# Patient Record
Sex: Male | Born: 1980 | Race: Black or African American | Hispanic: No | Marital: Single | Smoking: Never smoker
Health system: Southern US, Community
[De-identification: ages and names within clinical notes are randomized; demographics above are authoritative.]

## PROBLEM LIST (undated history)

## (undated) HISTORY — PX: EYE SURGERY: SHX253

---

## 2010-07-19 ENCOUNTER — Inpatient Hospital Stay (HOSPITAL_COMMUNITY)
Admission: EM | Admit: 2010-07-19 | Discharge: 2010-07-24 | Payer: Self-pay | Source: Home / Self Care | Attending: Internal Medicine | Admitting: Internal Medicine

## 2010-07-21 LAB — DIFFERENTIAL
Basophils Absolute: 0 10*3/uL (ref 0.0–0.1)
Basophils Relative: 0 % (ref 0–1)
Eosinophils Absolute: 0 10*3/uL (ref 0.0–0.7)
Eosinophils Relative: 0 % (ref 0–5)
Lymphocytes Relative: 7 % — ABNORMAL LOW (ref 12–46)
Lymphs Abs: 1.4 10*3/uL (ref 0.7–4.0)
Monocytes Absolute: 1.2 10*3/uL — ABNORMAL HIGH (ref 0.1–1.0)
Monocytes Relative: 6 % (ref 3–12)
Neutro Abs: 17.7 10*3/uL — ABNORMAL HIGH (ref 1.7–7.7)
Neutrophils Relative %: 87 % — ABNORMAL HIGH (ref 43–77)

## 2010-07-21 LAB — BASIC METABOLIC PANEL
BUN: 37 mg/dL — ABNORMAL HIGH (ref 6–23)
BUN: 27 mg/dL — ABNORMAL HIGH (ref 6–23)
BUN: 25 mg/dL — ABNORMAL HIGH (ref 6–23)
BUN: 21 mg/dL (ref 6–23)
BUN: 20 mg/dL (ref 6–23)
CO2: 17 mEq/L — ABNORMAL LOW (ref 19–32)
CO2: 22 mEq/L (ref 19–32)
CO2: 23 mEq/L (ref 19–32)
CO2: 24 mEq/L (ref 19–32)
CO2: 22 mEq/L (ref 19–32)
Calcium: 9.3 mg/dL (ref 8.4–10.5)
Calcium: 9 mg/dL (ref 8.4–10.5)
Calcium: 8.8 mg/dL (ref 8.4–10.5)
Calcium: 8.8 mg/dL (ref 8.4–10.5)
Calcium: 8.3 mg/dL — ABNORMAL LOW (ref 8.4–10.5)
Chloride: 106 mEq/L (ref 96–112)
Chloride: 116 mEq/L — ABNORMAL HIGH (ref 96–112)
Chloride: 117 mEq/L — ABNORMAL HIGH (ref 96–112)
Chloride: 113 mEq/L — ABNORMAL HIGH (ref 96–112)
Chloride: 109 mEq/L (ref 96–112)
Creatinine, Ser: 2.54 mg/dL — ABNORMAL HIGH (ref 0.4–1.5)
Creatinine, Ser: 1.87 mg/dL — ABNORMAL HIGH (ref 0.4–1.5)
Creatinine, Ser: 1.66 mg/dL — ABNORMAL HIGH (ref 0.4–1.5)
Creatinine, Ser: 1.63 mg/dL — ABNORMAL HIGH (ref 0.4–1.5)
Creatinine, Ser: 1.6 mg/dL — ABNORMAL HIGH (ref 0.4–1.5)
GFR calc Af Amer: 36 mL/min — ABNORMAL LOW (ref >60)
GFR calc Af Amer: 52 mL/min — ABNORMAL LOW (ref >60)
GFR calc Af Amer: 60 mL/min — ABNORMAL LOW (ref >60)
GFR calc Af Amer: >60 mL/min (ref >60)
GFR calc Af Amer: >60 mL/min (ref >60)
GFR calc non Af Amer: 30 mL/min — ABNORMAL LOW (ref >60)
GFR calc non Af Amer: 43 mL/min — ABNORMAL LOW (ref >60)
GFR calc non Af Amer: 49 mL/min — ABNORMAL LOW (ref >60)
GFR calc non Af Amer: 50 mL/min — ABNORMAL LOW (ref >60)
GFR calc non Af Amer: 51 mL/min — ABNORMAL LOW (ref >60)
Glucose, Bld: 760 mg/dL (ref 70–99)
Glucose, Bld: 347 mg/dL — ABNORMAL HIGH (ref 70–99)
Glucose, Bld: 223 mg/dL — ABNORMAL HIGH (ref 70–99)
Glucose, Bld: 158 mg/dL — ABNORMAL HIGH (ref 70–99)
Glucose, Bld: 240 mg/dL — ABNORMAL HIGH (ref 70–99)
Potassium: 5.5 mEq/L — ABNORMAL HIGH (ref 3.5–5.1)
Potassium: 4.5 mEq/L (ref 3.5–5.1)
Potassium: 4.1 mEq/L (ref 3.5–5.1)
Potassium: 4 mEq/L (ref 3.5–5.1)
Potassium: 3.8 mEq/L (ref 3.5–5.1)
Sodium: 146 mEq/L — ABNORMAL HIGH (ref 135–145)
Sodium: 149 mEq/L — ABNORMAL HIGH (ref 135–145)
Sodium: 147 mEq/L — ABNORMAL HIGH (ref 135–145)
Sodium: 144 mEq/L (ref 135–145)
Sodium: 139 mEq/L (ref 135–145)

## 2010-07-21 LAB — GLUCOSE, CAPILLARY
Glucose-Capillary: >600 mg/dL (ref 70–99)
Glucose-Capillary: >600 mg/dL (ref 70–99)
Glucose-Capillary: >600 mg/dL (ref 70–99)
Glucose-Capillary: 143 mg/dL — ABNORMAL HIGH (ref 70–99)
Glucose-Capillary: 175 mg/dL — ABNORMAL HIGH (ref 70–99)
Glucose-Capillary: 183 mg/dL — ABNORMAL HIGH (ref 70–99)
Glucose-Capillary: 199 mg/dL — ABNORMAL HIGH (ref 70–99)
Glucose-Capillary: 208 mg/dL — ABNORMAL HIGH (ref 70–99)
Glucose-Capillary: 210 mg/dL — ABNORMAL HIGH (ref 70–99)
Glucose-Capillary: 214 mg/dL — ABNORMAL HIGH (ref 70–99)
Glucose-Capillary: 267 mg/dL — ABNORMAL HIGH (ref 70–99)
Glucose-Capillary: 275 mg/dL — ABNORMAL HIGH (ref 70–99)
Glucose-Capillary: 362 mg/dL — ABNORMAL HIGH (ref 70–99)
Glucose-Capillary: 372 mg/dL — ABNORMAL HIGH (ref 70–99)
Glucose-Capillary: 375 mg/dL — ABNORMAL HIGH (ref 70–99)
Glucose-Capillary: 521 mg/dL — ABNORMAL HIGH (ref 70–99)
Glucose-Capillary: 521 mg/dL — ABNORMAL HIGH (ref 70–99)
Glucose-Capillary: >600 mg/dL (ref 70–99)

## 2010-07-21 LAB — BLOOD GAS, VENOUS
Acid-base deficit: 10.9 mmol/L — ABNORMAL HIGH (ref 0.0–2.0)
Bicarbonate: 17.1 mEq/L — ABNORMAL LOW (ref 20.0–24.0)
O2 Saturation: 63.6 %
Patient temperature: 98.6
TCO2: 15.3 mmol/L (ref 0–100)
pCO2, Ven: 44.8 mmHg — ABNORMAL LOW (ref 45.0–50.0)
pH, Ven: 7.206 — ABNORMAL LOW (ref 7.250–7.300)
pO2, Ven: 39.3 mmHg (ref 30.0–45.0)

## 2010-07-21 LAB — BLOOD GAS, ARTERIAL
Acid-base deficit: 13.5 mmol/L — ABNORMAL HIGH (ref 0.0–2.0)
Bicarbonate: 12.9 mEq/L — ABNORMAL LOW (ref 20.0–24.0)
Drawn by: 326301
FIO2: 0.21 %
O2 Saturation: 96.3 %
Patient temperature: 98.6
TCO2: 11.6 mmol/L (ref 0–100)
pCO2 arterial: 31.9 mmHg — ABNORMAL LOW (ref 35.0–45.0)
pH, Arterial: 7.23 — ABNORMAL LOW (ref 7.350–7.450)
pO2, Arterial: 96.6 mmHg (ref 80.0–100.0)

## 2010-07-21 LAB — COMPREHENSIVE METABOLIC PANEL
ALT: 34 U/L (ref 0–53)
AST: 25 U/L (ref 0–37)
Albumin: 5 g/dL (ref 3.5–5.2)
Alkaline Phosphatase: 88 U/L (ref 39–117)
BUN: 40 mg/dL — ABNORMAL HIGH (ref 6–23)
CO2: 18 mEq/L — ABNORMAL LOW (ref 19–32)
Calcium: 10.9 mg/dL — ABNORMAL HIGH (ref 8.4–10.5)
Chloride: 91 mEq/L — ABNORMAL LOW (ref 96–112)
Creatinine, Ser: 3.14 mg/dL — ABNORMAL HIGH (ref 0.4–1.5)
GFR calc Af Amer: 29 mL/min — ABNORMAL LOW (ref >60)
GFR calc non Af Amer: 24 mL/min — ABNORMAL LOW (ref >60)
Glucose, Bld: 1169 mg/dL (ref 70–99)
Potassium: 5.7 mEq/L — ABNORMAL HIGH (ref 3.5–5.1)
Sodium: 140 mEq/L (ref 135–145)
Total Bilirubin: 1.7 mg/dL — ABNORMAL HIGH (ref 0.3–1.2)
Total Protein: 9 g/dL — ABNORMAL HIGH (ref 6.0–8.3)

## 2010-07-21 LAB — MRSA PCR SCREENING: MRSA by PCR: NEGATIVE

## 2010-07-21 LAB — URINALYSIS, ROUTINE W REFLEX MICROSCOPIC
Bilirubin Urine: NEGATIVE
Ketones, ur: 40 mg/dL — AB
Leukocytes, UA: NEGATIVE
Nitrite: NEGATIVE
Protein, ur: NEGATIVE mg/dL
Specific Gravity, Urine: 1.034 — ABNORMAL HIGH (ref 1.005–1.030)
Urine Glucose, Fasting: >1000 mg/dL — AB
Urobilinogen, UA: 0.2 mg/dL (ref 0.0–1.0)
pH: 5 (ref 5.0–8.0)

## 2010-07-21 LAB — MAGNESIUM
Magnesium: 2.9 mg/dL — ABNORMAL HIGH (ref 1.5–2.5)
Magnesium: 2.9 mg/dL — ABNORMAL HIGH (ref 1.5–2.5)

## 2010-07-21 LAB — CBC
HCT: 55.3 % — ABNORMAL HIGH (ref 39.0–52.0)
HCT: 42.5 % (ref 39.0–52.0)
Hemoglobin: 17.8 g/dL — ABNORMAL HIGH (ref 13.0–17.0)
Hemoglobin: 14.1 g/dL (ref 13.0–17.0)
MCH: 29.1 pg (ref 26.0–34.0)
MCH: 28.5 pg (ref 26.0–34.0)
MCHC: 32.2 g/dL (ref 30.0–36.0)
MCHC: 33.2 g/dL (ref 30.0–36.0)
MCV: 90.5 fL (ref 78.0–100.0)
MCV: 86 fL (ref 78.0–100.0)
Platelets: 269 10*3/uL (ref 150–400)
Platelets: 246 10*3/uL (ref 150–400)
RBC: 6.11 MIL/uL — ABNORMAL HIGH (ref 4.22–5.81)
RBC: 4.94 MIL/uL (ref 4.22–5.81)
RDW: 12.8 % (ref 11.5–15.5)
RDW: 12.2 % (ref 11.5–15.5)
WBC: 20.3 10*3/uL — ABNORMAL HIGH (ref 4.0–10.5)
WBC: 20 10*3/uL — ABNORMAL HIGH (ref 4.0–10.5)

## 2010-07-21 LAB — PHOSPHORUS
Phosphorus: 6.6 mg/dL — ABNORMAL HIGH (ref 2.3–4.6)
Phosphorus: 2.2 mg/dL — ABNORMAL LOW (ref 2.3–4.6)
Phosphorus: 2.2 mg/dL — ABNORMAL LOW (ref 2.3–4.6)

## 2010-07-21 LAB — RAPID URINE DRUG SCREEN, HOSP PERFORMED
Amphetamines: NOT DETECTED
Barbiturates: NOT DETECTED
Benzodiazepines: NOT DETECTED
Cocaine: NOT DETECTED
Opiates: NOT DETECTED
Tetrahydrocannabinol: NOT DETECTED

## 2010-07-21 LAB — URINE MICROSCOPIC-ADD ON

## 2010-07-21 LAB — TSH: TSH: 1.481 u[IU]/mL (ref 0.350–4.500)

## 2010-07-21 LAB — KETONES, QUALITATIVE

## 2010-07-21 LAB — GLUCOSE, RANDOM: Glucose, Bld: 511 mg/dL — ABNORMAL HIGH (ref 70–99)

## 2010-07-26 LAB — CBC
HCT: 34.5 % — ABNORMAL LOW (ref 39.0–52.0)
MCHC: 32.8 g/dL (ref 30.0–36.0)
RDW: 11.9 % (ref 11.5–15.5)

## 2010-07-26 LAB — COMPREHENSIVE METABOLIC PANEL
ALT: 19 U/L (ref 0–53)
Calcium: 8.5 mg/dL (ref 8.4–10.5)
Creatinine, Ser: 1.18 mg/dL (ref 0.4–1.5)
GFR calc Af Amer: >60 mL/min (ref >60)
Glucose, Bld: 253 mg/dL — ABNORMAL HIGH (ref 70–99)
Sodium: 145 mEq/L (ref 135–145)
Total Protein: 5.1 g/dL — ABNORMAL LOW (ref 6.0–8.3)

## 2010-07-26 LAB — BASIC METABOLIC PANEL
BUN: 16 mg/dL (ref 6–23)
CO2: 21 mEq/L (ref 19–32)
CO2: 27 mEq/L (ref 19–32)
Calcium: 8.7 mg/dL (ref 8.4–10.5)
Chloride: 111 mEq/L (ref 96–112)
Chloride: 109 mEq/L (ref 96–112)
Creatinine, Ser: 1.51 mg/dL — ABNORMAL HIGH (ref 0.4–1.5)
Creatinine, Ser: 1.01 mg/dL (ref 0.4–1.5)
GFR calc Af Amer: >60 mL/min (ref >60)
GFR calc Af Amer: >60 mL/min (ref >60)
GFR calc non Af Amer: 55 mL/min — ABNORMAL LOW (ref >60)
Glucose, Bld: 305 mg/dL — ABNORMAL HIGH (ref 70–99)
Potassium: 4.4 mEq/L (ref 3.5–5.1)
Potassium: 3.2 mEq/L — ABNORMAL LOW (ref 3.5–5.1)
Sodium: 141 mEq/L (ref 135–145)
Sodium: 143 mEq/L (ref 135–145)

## 2010-07-26 LAB — PHOSPHORUS
Phosphorus: 2.5 mg/dL (ref 2.3–4.6)
Phosphorus: 1.7 mg/dL — ABNORMAL LOW (ref 2.3–4.6)
Phosphorus: 1.9 mg/dL — ABNORMAL LOW (ref 2.3–4.6)
Phosphorus: 2.7 mg/dL (ref 2.3–4.6)

## 2010-07-26 LAB — HEMOGLOBIN A1C
Hgb A1c MFr Bld: 13.5 % — ABNORMAL HIGH (ref <5.7)
Mean Plasma Glucose: 341 mg/dL — ABNORMAL HIGH (ref <117)

## 2010-07-26 LAB — GLUCOSE, CAPILLARY
Glucose-Capillary: 273 mg/dL — ABNORMAL HIGH (ref 70–99)
Glucose-Capillary: 377 mg/dL — ABNORMAL HIGH (ref 70–99)
Glucose-Capillary: 388 mg/dL — ABNORMAL HIGH (ref 70–99)
Glucose-Capillary: 353 mg/dL — ABNORMAL HIGH (ref 70–99)
Glucose-Capillary: 251 mg/dL — ABNORMAL HIGH (ref 70–99)
Glucose-Capillary: 256 mg/dL — ABNORMAL HIGH (ref 70–99)
Glucose-Capillary: 312 mg/dL — ABNORMAL HIGH (ref 70–99)
Glucose-Capillary: 260 mg/dL — ABNORMAL HIGH (ref 70–99)
Glucose-Capillary: 235 mg/dL — ABNORMAL HIGH (ref 70–99)

## 2010-07-26 LAB — MAGNESIUM: Magnesium: 2 mg/dL (ref 1.5–2.5)

## 2010-07-26 LAB — CHOLESTEROL, TOTAL: Cholesterol: 130 mg/dL (ref 0–200)

## 2010-07-27 LAB — BASIC METABOLIC PANEL WITH GFR
BUN: 8 mg/dL (ref 6–23)
CO2: 29 meq/L (ref 19–32)
Calcium: 9 mg/dL (ref 8.4–10.5)
Chloride: 111 meq/L (ref 96–112)
Creatinine, Ser: 1.02 mg/dL (ref 0.4–1.5)
GFR calc non Af Amer: >60 mL/min
Glucose, Bld: 169 mg/dL — ABNORMAL HIGH (ref 70–99)
Potassium: 3.1 meq/L — ABNORMAL LOW (ref 3.5–5.1)
Sodium: 146 meq/L — ABNORMAL HIGH (ref 135–145)

## 2011-01-29 ENCOUNTER — Emergency Department (HOSPITAL_COMMUNITY)
Admission: EM | Admit: 2011-01-29 | Discharge: 2011-01-30 | Disposition: A | Discharge status: Home / Self Care | Payer: Self-pay | Attending: Emergency Medicine | Admitting: Emergency Medicine

## 2011-01-29 DIAGNOSIS — E119 Type 2 diabetes mellitus without complications: Secondary | ICD-10-CM | POA: Insufficient documentation

## 2011-01-29 DIAGNOSIS — Z5900 Homelessness unspecified: Secondary | ICD-10-CM | POA: Insufficient documentation

## 2011-01-29 DIAGNOSIS — Z794 Long term (current) use of insulin: Secondary | ICD-10-CM | POA: Insufficient documentation

## 2011-01-29 LAB — POCT I-STAT, CHEM 8
Chloride: 102 mEq/L (ref 96–112)
Creatinine, Ser: 1.1 mg/dL (ref 0.50–1.35)
Glucose, Bld: 400 mg/dL — ABNORMAL HIGH (ref 70–99)
Potassium: 3.7 mEq/L (ref 3.5–5.1)
Sodium: 135 mEq/L (ref 135–145)

## 2011-01-29 LAB — URINALYSIS, ROUTINE W REFLEX MICROSCOPIC
Glucose, UA: >1000 mg/dL — AB
Ketones, ur: >80 mg/dL — AB
Leukocytes, UA: NEGATIVE
Nitrite: NEGATIVE
Protein, ur: NEGATIVE mg/dL
pH: 5.5 (ref 5.0–8.0)

## 2011-01-29 LAB — GLUCOSE, CAPILLARY: Glucose-Capillary: 313 mg/dL — ABNORMAL HIGH (ref 70–99)

## 2011-01-29 LAB — URINE MICROSCOPIC-ADD ON: Urine-Other: NONE SEEN

## 2011-06-21 IMAGING — CR DG CHEST 1V PORT
1 series · 1 of 1 positions shown · non-contrast
Comparison: None.

CLINICAL DATA: Diabetic ketoacidosis.

PORTABLE CHEST - 1 VIEW

[AP]
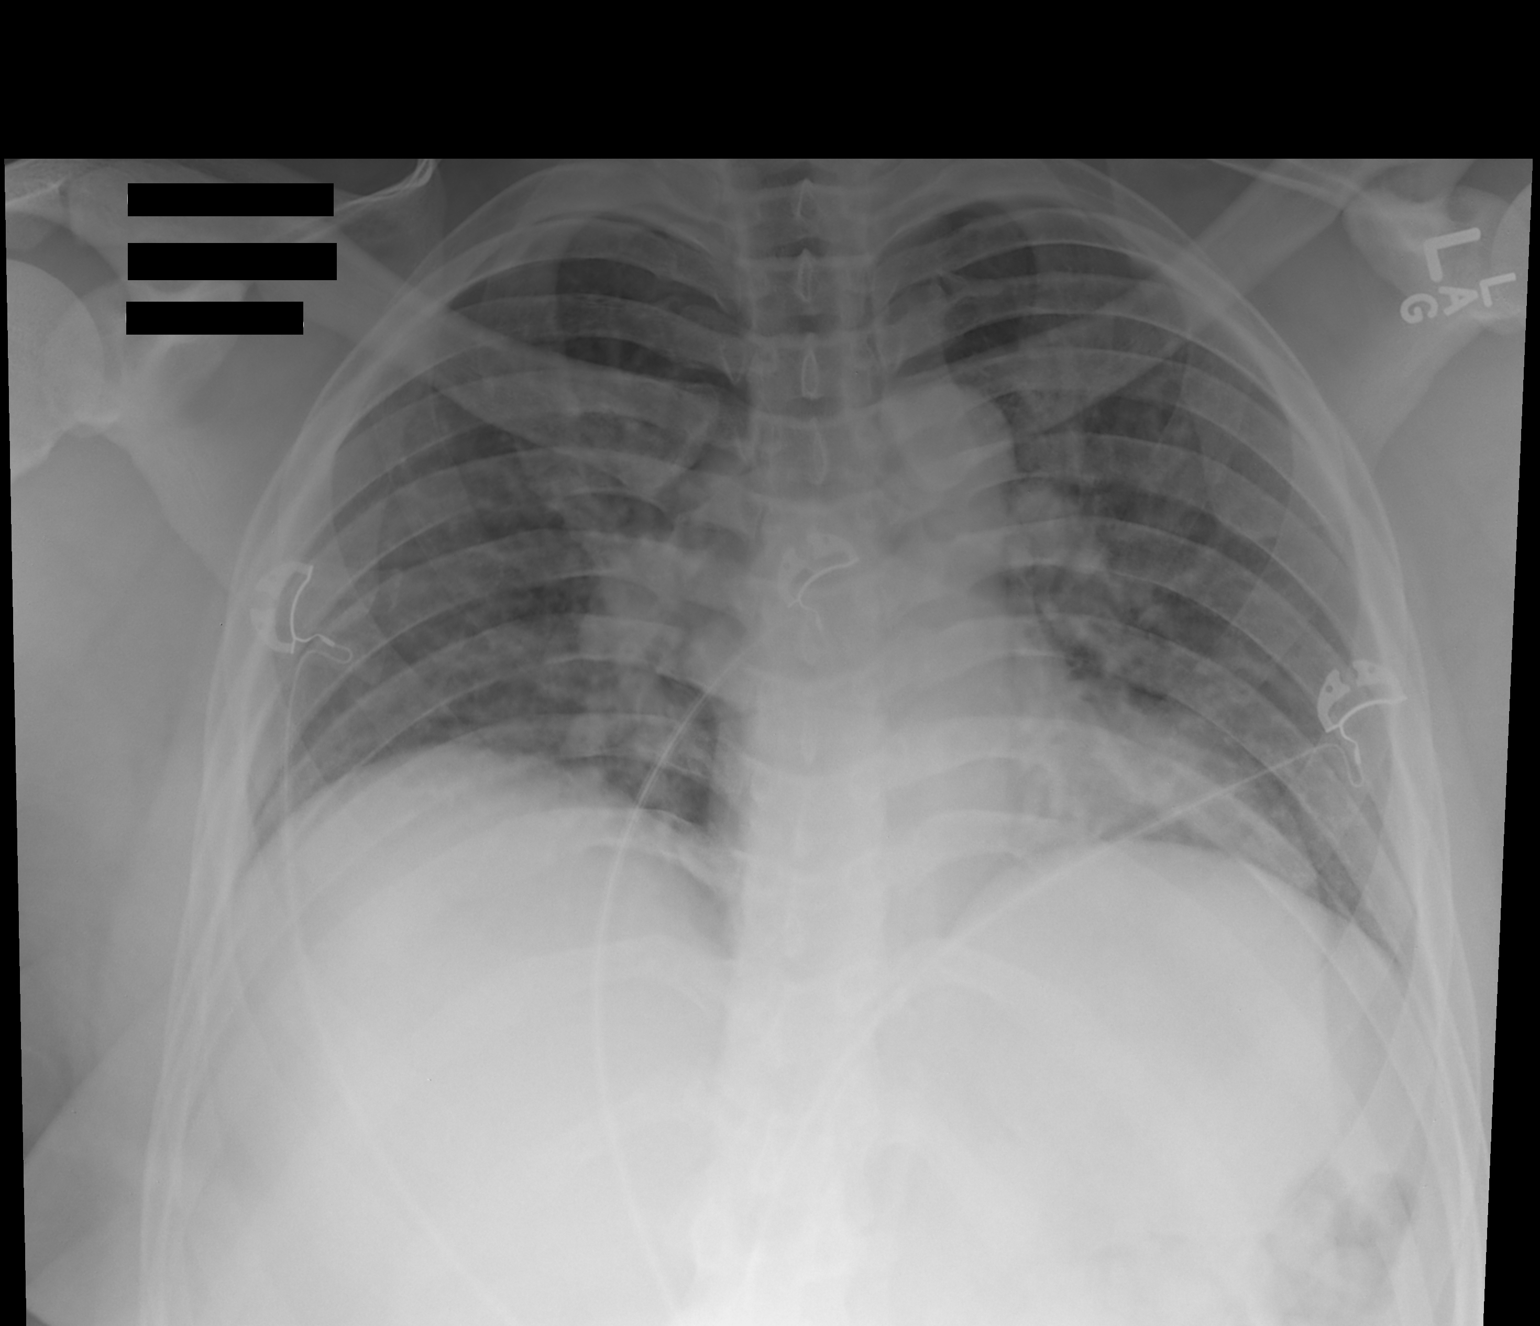

[1 of 1 positions shown; findings below may reference images not displayed]

FINDINGS: The heart size is normal.  Pulmonary vascularity and
interstitial markings are accentuated due to a shallow inspiration.
No discrete consolidative infiltrates or effusions.  No osseous
abnormality.
IMPRESSION: Normal chest considering the shallow inspiration.

## 2011-12-13 ENCOUNTER — Emergency Department (HOSPITAL_COMMUNITY)
Admission: EM | Admit: 2011-12-13 | Discharge: 2011-12-13 | Disposition: A | Discharge status: Home / Self Care | Payer: Self-pay | Attending: Emergency Medicine | Admitting: Emergency Medicine

## 2011-12-13 ENCOUNTER — Encounter (HOSPITAL_COMMUNITY): Payer: Self-pay | Admitting: *Deleted

## 2011-12-13 DIAGNOSIS — Z794 Long term (current) use of insulin: Secondary | ICD-10-CM | POA: Insufficient documentation

## 2011-12-13 DIAGNOSIS — R739 Hyperglycemia, unspecified: Secondary | ICD-10-CM

## 2011-12-13 DIAGNOSIS — E1169 Type 2 diabetes mellitus with other specified complication: Secondary | ICD-10-CM | POA: Insufficient documentation

## 2011-12-13 LAB — COMPREHENSIVE METABOLIC PANEL
Alkaline Phosphatase: 101 U/L (ref 39–117)
BUN: 19 mg/dL (ref 6–23)
CO2: 21 mEq/L (ref 19–32)
Calcium: 9.7 mg/dL (ref 8.4–10.5)
GFR calc Af Amer: >90 mL/min (ref >90)
GFR calc non Af Amer: >90 mL/min (ref >90)
Glucose, Bld: 557 mg/dL (ref 70–99)
Potassium: 4.3 mEq/L (ref 3.5–5.1)
Total Protein: 7.1 g/dL (ref 6.0–8.3)

## 2011-12-13 LAB — CBC
Hemoglobin: 14.6 g/dL (ref 13.0–17.0)
MCH: 28.7 pg (ref 26.0–34.0)
MCV: 84.3 fL (ref 78.0–100.0)
RBC: 5.08 MIL/uL (ref 4.22–5.81)

## 2011-12-13 LAB — DIFFERENTIAL
Eosinophils Absolute: 0.2 10*3/uL (ref 0.0–0.7)
Eosinophils Relative: 2 % (ref 0–5)
Lymphs Abs: 1.6 10*3/uL (ref 0.7–4.0)
Monocytes Relative: 7 % (ref 3–12)

## 2011-12-13 LAB — URINALYSIS, ROUTINE W REFLEX MICROSCOPIC
Ketones, ur: 15 mg/dL — AB
Leukocytes, UA: NEGATIVE
Nitrite: NEGATIVE
Urobilinogen, UA: 0.2 mg/dL (ref 0.0–1.0)
pH: 5.5 (ref 5.0–8.0)

## 2011-12-13 LAB — GLUCOSE, CAPILLARY
Glucose-Capillary: 474 mg/dL — ABNORMAL HIGH (ref 70–99)
Glucose-Capillary: 204 mg/dL — ABNORMAL HIGH (ref 70–99)

## 2011-12-13 MED ORDER — SODIUM CHLORIDE 0.9 % IV BOLUS (SEPSIS)
1000.0000 mL | Freq: Once | INTRAVENOUS | Status: AC
  Administered 2011-12-13: 1000 mL via INTRAVENOUS

## 2011-12-13 MED ORDER — INSULIN ASPART 100 UNIT/ML ~~LOC~~ SOLN
10.0000 [IU] | Freq: Once | SUBCUTANEOUS | Status: AC
  Administered 2011-12-13: 10 [IU] via SUBCUTANEOUS
  Filled 2011-12-13: qty 1

## 2011-12-13 MED ORDER — SODIUM CHLORIDE 0.9 % IV SOLN
INTRAVENOUS | Status: DC
  Administered 2011-12-13: 18:00:00 via INTRAVENOUS
  Filled 2011-12-13: qty 1

## 2011-12-13 MED ORDER — INSULIN GLARGINE 100 UNIT/ML ~~LOC~~ SOLN: 45.0000 [IU] | Freq: Every day | SUBCUTANEOUS | Status: AC

## 2011-12-13 MED ORDER — METFORMIN HCL 500 MG PO TABS: 500.0000 mg | ORAL_TABLET | Freq: Two times a day (BID) | ORAL | Status: DC

## 2011-12-13 MED ORDER — INSULIN REGULAR HUMAN 100 UNIT/ML IJ SOLN: 5.0000 [IU] | Freq: Once | INTRAMUSCULAR | Status: DC

## 2011-12-13 NOTE — ED Notes (Signed)
Pt states he started to feel weak at home. Pt states is does not have the money to get his medication. Pt states he is having legs cramps, frequent urination, dry mouth. Pt cgb taken in triage 541

## 2011-12-13 NOTE — ED Provider Notes (Signed)
History     CSN: [id number]  Arrival date & time 12/13/11  1451   First MD Initiated Contact with Patient 12/13/11 1543      Chief Complaint  Patient presents with  . Hyperglycemia    (Consider location/radiation/quality/duration/timing/severity/associated sxs/prior treatment) HPI  H/o IDDM pw concern from hyperglycemia. C/o generalized fatigue, polydipsia, polyuria x several months- since December. Has not had any insulin since that time. Denies fever/chills. Denies headache, dizziness. Denies abd pain/n/v. Denies back pain. Denies hematuria/dysuria/freq/urgency. Denies sick contacts.  PMD- none   ED Notes, ED Provider Notes from 12/13/11 0000 to 12/13/11 15:31:41       Norina Buzzard, RN 12/13/2011 15:30      Pt states he started to feel weak at home. Pt states is does not have the money to get his medication. Pt states he is having legs cramps, frequent urination, dry mouth. Pt cgb taken in triage 541     Past Medical History  Diagnosis Date  . Diabetes mellitus     History reviewed. No pertinent past surgical history.  No family history on file.  History  Substance Use Topics  . Smoking status: Never Smoker   . Smokeless tobacco: Not on file  . Alcohol Use: No    Review of Systems except as noted HPI   Allergies  Review of patient's allergies indicates no known allergies.  Home Medications   Current Outpatient Rx  Name Route Sig Dispense Refill  . INSULIN ASPART PROT & ASPART (70-30) 100 UNIT/ML [place] SUSP Subcutaneous Inject into the skin See admin instructions. Given according to sliding scale    . INSULIN GLARGINE 100 UNIT/ML [place] SOLN Subcutaneous Inject 45 Units into the skin every 12 (twelve) hours.    Marland Kitchen METFORMIN HCL 500 MG PO TABS Oral Take 500 mg by mouth 2 (two) times daily with a meal.    . INSULIN GLARGINE 100 UNIT/ML [place] SOLN Subcutaneous Inject 45 Units into the skin at bedtime. 10 mL 12  . METFORMIN HCL 500 MG PO TABS Oral Take 1 tablet (500  mg total) by mouth 2 (two) times daily with a meal. 30 tablet 0    BP 115/62  Pulse 81  Temp 98.4 F (36.9 C)  Resp 16  Wt 225 lb 6 oz (102.229 kg)  SpO2 100%  Physical Exam  ED Course  Procedures (including critical care time)  Labs Reviewed  COMPREHENSIVE METABOLIC PANEL - Abnormal; Notable for the following:    Sodium 130 (*)    Glucose, Bld 557 (*)    Total Bilirubin 0.2 (*)    All other components within normal limits  URINALYSIS, ROUTINE W REFLEX MICROSCOPIC - Abnormal; Notable for the following:    Specific Gravity, Urine 1.038 (*)    Glucose, UA >1000 (*)    Ketones, ur 15 (*)    All other components within normal limits  GLUCOSE, CAPILLARY - Abnormal; Notable for the following:    Glucose-Capillary 541 (*)    All other components within normal limits  GLUCOSE, CAPILLARY - Abnormal; Notable for the following:    Glucose-Capillary 558 (*)    All other components within normal limits  GLUCOSE, CAPILLARY - Abnormal; Notable for the following:    Glucose-Capillary 474 (*)    All other components within normal limits  GLUCOSE, CAPILLARY - Abnormal; Notable for the following:    Glucose-Capillary 323 (*)    All other components within normal limits  GLUCOSE, CAPILLARY - Abnormal; Notable for the following:  Glucose-Capillary 204 (*)    All other components within normal limits  CBC  DIFFERENTIAL  URINE MICROSCOPIC-ADD ON   No results found.   1. Hyperglycemia     MDM  Hyperglycemia 2/2 medication noncompliance. No DKA/HONK syndrome. Glucose has come down quite nicely here with glucose stabilizer and insulin bolus. The patient continues to remain table in the emergency department. Case management has been consult to and states that he is not eligible for free medications from the pharmacy as he is uses resource in the past. He has also been referred to health service the past and has not followed up with them. The patient has been given resources for financial  assistance from case management. He has been prescribed his previous Lantus and metformin prescriptions. He will need to followup with primary care doctor in the next 2-3 days for recommendations regarding sliding scale insulin, continue Lantus, adjustment of metformin dosage.        Forbes Cellar, MD 12/13/11 2202

## 2011-12-13 NOTE — Discharge Instructions (Signed)
Hyperglycemia Hyperglycemia occurs when the glucose (sugar) in your blood is too high. Hyperglycemia can happen for many reasons, but it most often happens to people who do not know they have diabetes or are not managing their diabetes properly.  CAUSES  Whether you have diabetes or not, there are other causes of hyperglycemia. Hyperglycemia can occur when you have diabetes, but it can also occur in other situations that you might not be as aware of, such as: Diabetes  If you have diabetes and are having problems controlling your blood glucose, hyperglycemia could occur because of some of the following reasons:   Not following your meal plan.   Not taking your diabetes medications or not taking it properly.   Exercising less or doing less activity than you normally do.   Being sick.  Pre-diabetes  This cannot be ignored. Before people develop Type 2 diabetes, they almost always have "pre-diabetes." This is when your blood glucose levels are higher than normal, but not yet high enough to be diagnosed as diabetes. Research has shown that some long-term damage to the body, especially the heart and circulatory system, may already be occurring during pre-diabetes. If you take action to manage your blood glucose when you have pre-diabetes, you may delay or prevent Type 2 diabetes from developing.  Stress  If you have diabetes, you may be "diet" controlled or on oral medications or insulin to control your diabetes. However, you may find that your blood glucose is higher than usual in the hospital whether you have diabetes or not. This is often referred to as "stress hyperglycemia." Stress can elevate your blood glucose. This happens because of hormones put out by the body during times of stress. If stress has been the cause of your high blood glucose, it can be followed regularly by your caregiver. That way he/she can make sure your hyperglycemia does not continue to get worse or progress to diabetes.    Steroids  Steroids are medications that act on the infection fighting system (immune system) to block inflammation or infection. One side effect can be a rise in blood glucose. Most people can produce enough extra insulin to allow for this rise, but for those who cannot, steroids make blood glucose levels go even higher. It is not unusual for steroid treatments to "uncover" diabetes that is developing. It is not always possible to determine if the hyperglycemia will go away after the steroids are stopped. A special blood test called an A1c is sometimes done to determine if your blood glucose was elevated before the steroids were started.  SYMPTOMS  Thirsty.   Frequent urination.   Dry mouth.   Blurred vision.   Tired or fatigue.   Weakness.   Sleepy.   Tingling in feet or leg.  DIAGNOSIS  Diagnosis is made by monitoring blood glucose in one or all of the following ways:  A1c test. This is a chemical found in your blood.   Fingerstick blood glucose monitoring.   Laboratory results.  TREATMENT  First, knowing the cause of the hyperglycemia is important before the hyperglycemia can be treated. Treatment may include, but is not be limited to:  Education.   Change or adjustment in medications.   Change or adjustment in meal plan.   Treatment for an illness, infection, etc.   More frequent blood glucose monitoring.   Change in exercise plan.   Decreasing or stopping steroids.   Lifestyle changes.  HOME CARE INSTRUCTIONS   Test your blood glucose   as directed.   Exercise regularly. Your caregiver will give you instructions about exercise. Pre-diabetes or diabetes which comes on with stress is helped by exercising.   Eat wholesome, balanced meals. Eat often and at regular, fixed times. Your caregiver or nutritionist will give you a meal plan to guide your sugar intake.   Being at an ideal weight is important. If needed, losing as little as 10 to 15 pounds may help  improve blood glucose levels.  SEEK MEDICAL CARE IF:   You have questions about medicine, activity, or diet.   You continue to have symptoms (problems such as increased thirst, urination, or weight gain).  SEEK IMMEDIATE MEDICAL CARE IF:   You are vomiting or have diarrhea.   Your breath smells fruity.   You are breathing faster or slower.   You are very sleepy or incoherent.   You have numbness, tingling, or pain in your feet or hands.   You have chest pain.   Your symptoms get worse even though you have been following your caregiver's orders.   If you have any other questions or concerns.  Document Released: 12/14/2000 Document Revised: 06/09/2011 Document Reviewed: 02/09/2009 ExitCare Patient Information 2012 [employer].  RESOURCE GUIDE  Dental Problems  Patients with Medicaid: [facility]                     [facility] [address]                                           [address] Phone:  [phone]                                                   Phone:  [phone]  If unable to pay or uninsured, contact:  [facility] or [place] [facility] to become qualified for the adult dental clinic.  Chronic Pain Problems Contact [facility]  [phone] Patients need to be referred by their primary care doctor.  Insufficient Money for Medicine Contact United Way:  call "211" or [facility] 271-5999.  No Primary Care Doctor Call [facility]  832-8000 Other agencies that provide inexpensive medical care    [facility]  [phone]    [facility]  [phone]    [facility]  [phone]    Women's Clinic  [phone]    Planned Parenthood  [phone]    [facility]  [phone]  Psychological Services [facility]  [phone] [facility]  [phone] [place] [facility]   [phone] (emergency services  641-4993)  Abuse/Neglect [place] Child Abuse Hotline (336) 641-3795 [place] Child Abuse Hotline 800-378-5315 (After Hours)  Emergency Shelter [facility] (336) 271-5985  Maternity Homes [facility] (336) 275-9566 [facility] (704) 372-4663  MRSA Hotline #:   832-7006    [place] Resources  [facility] [place]  United Way                           [facility] [facility] [address] [place]                       [address]         [address]  [place]                                               [place]                              [place] Phone:  [phone]                                  Phone:  [phone]                   Phone:  [phone]  [facility] Phone:  [phone]  [place] Child Abuse Hotline (336) 342-1394 (336) 342-3537 (After Hours)  

## 2011-12-13 NOTE — Progress Notes (Signed)
ED CM noted pt had been given an appointment for 08/25/10 AT 230PM with Dr Andrey Campanile on [facility] by [facility] CM staff

## 2011-12-13 NOTE — Progress Notes (Signed)
ED Spoke with pt and ED RN about medication assistance [facility] contacted to confirm pt is not eligible for chs indigent medication assistance Pt received assistance in 01/2011.  CM explained chs program and ineligibility again to pt.  CM reviewed and provided written information for needy meds.com, self pay pcps (general medical, [facility] and [facility]), discounted pharmacy, local financial assistance programs, and [place] crisis programs  Pt inquired about the availability to get Rx for [facility] brand insulin from EDP prior to d/c ED CM spoke with EDP about pt's request

## 2011-12-13 NOTE — ED Notes (Signed)
Glucostabilizer rate at this time=1.4 units/hr.

## 2011-12-13 NOTE — Progress Notes (Signed)
Pt listed as self pay with no insurance coverage Pt confirms he is self pay [place] resident CM and [facility] community liaison spoke with him Pt offered [facility] services to assist with finding a [place] self pay provider Pt accepted information Pt states he has tried [facility] before and be turned down before Pt informed about [facility] and [facility]
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
## 2015-09-23 ENCOUNTER — Ambulatory Visit: Payer: Self-pay | Admitting: Psychology

## 2015-10-09 ENCOUNTER — Ambulatory Visit: Payer: Self-pay | Attending: Internal Medicine

## 2015-12-08 ENCOUNTER — Emergency Department (HOSPITAL_COMMUNITY)
Admission: EM | Admit: 2015-12-08 | Discharge: 2015-12-08 | Disposition: A | Discharge status: Home / Self Care | Payer: Self-pay | Attending: Emergency Medicine | Admitting: Emergency Medicine

## 2015-12-08 ENCOUNTER — Encounter (HOSPITAL_COMMUNITY): Payer: Self-pay

## 2015-12-08 DIAGNOSIS — R030 Elevated blood-pressure reading, without diagnosis of hypertension: Secondary | ICD-10-CM | POA: Insufficient documentation

## 2015-12-08 DIAGNOSIS — R7401 Elevation of levels of liver transaminase levels: Secondary | ICD-10-CM

## 2015-12-08 DIAGNOSIS — E1165 Type 2 diabetes mellitus with hyperglycemia: Secondary | ICD-10-CM | POA: Insufficient documentation

## 2015-12-08 DIAGNOSIS — IMO0001 Reserved for inherently not codable concepts without codable children: Secondary | ICD-10-CM

## 2015-12-08 DIAGNOSIS — R600 Localized edema: Secondary | ICD-10-CM | POA: Insufficient documentation

## 2015-12-08 DIAGNOSIS — R740 Nonspecific elevation of levels of transaminase and lactic acid dehydrogenase [LDH]: Secondary | ICD-10-CM | POA: Insufficient documentation

## 2015-12-08 DIAGNOSIS — R809 Proteinuria, unspecified: Secondary | ICD-10-CM | POA: Insufficient documentation

## 2015-12-08 LAB — URINALYSIS, ROUTINE W REFLEX MICROSCOPIC
Bilirubin Urine: NEGATIVE
Glucose, UA: >1000 mg/dL — AB
KETONES UR: NEGATIVE mg/dL
LEUKOCYTES UA: NEGATIVE
NITRITE: NEGATIVE
PH: 5.5 (ref 5.0–8.0)
PROTEIN: 30 mg/dL — AB
Specific Gravity, Urine: 1.027 (ref 1.005–1.030)

## 2015-12-08 LAB — COMPREHENSIVE METABOLIC PANEL
ALK PHOS: 46 U/L (ref 38–126)
ALT: 100 U/L — ABNORMAL HIGH (ref 17–63)
ANION GAP: 6 (ref 5–15)
AST: 296 U/L — ABNORMAL HIGH (ref 15–41)
Albumin: 3.5 g/dL (ref 3.5–5.0)
BUN: 12 mg/dL (ref 6–20)
CALCIUM: 9.1 mg/dL (ref 8.9–10.3)
CO2: 27 mmol/L (ref 22–32)
Chloride: 110 mmol/L (ref 101–111)
Creatinine, Ser: 1.25 mg/dL — ABNORMAL HIGH (ref 0.61–1.24)
GFR calc non Af Amer: >60 mL/min (ref >60)
Glucose, Bld: 129 mg/dL — ABNORMAL HIGH (ref 65–99)
Potassium: 3.5 mmol/L (ref 3.5–5.1)
SODIUM: 143 mmol/L (ref 135–145)
TOTAL PROTEIN: 6 g/dL — AB (ref 6.5–8.1)
Total Bilirubin: 0.7 mg/dL (ref 0.3–1.2)

## 2015-12-08 LAB — CBC WITH DIFFERENTIAL/PLATELET
Basophils Absolute: 0 10*3/uL (ref 0.0–0.1)
Basophils Relative: 0 %
EOS ABS: 0.1 10*3/uL (ref 0.0–0.7)
EOS PCT: 1 %
HCT: 37.3 % — ABNORMAL LOW (ref 39.0–52.0)
HEMOGLOBIN: 12.2 g/dL — AB (ref 13.0–17.0)
LYMPHS ABS: 1.8 10*3/uL (ref 0.7–4.0)
Lymphocytes Relative: 21 %
MCH: 28.9 pg (ref 26.0–34.0)
MCHC: 32.7 g/dL (ref 30.0–36.0)
MCV: 88.4 fL (ref 78.0–100.0)
MONO ABS: 0.9 10*3/uL (ref 0.1–1.0)
MONOS PCT: 11 %
NEUTROS PCT: 67 %
Neutro Abs: 5.6 10*3/uL (ref 1.7–7.7)
Platelets: 196 10*3/uL (ref 150–400)
RBC: 4.22 MIL/uL (ref 4.22–5.81)
RDW: 13.6 % (ref 11.5–15.5)
WBC: 8.5 10*3/uL (ref 4.0–10.5)

## 2015-12-08 LAB — URINE MICROSCOPIC-ADD ON: RBC / HPF: NONE SEEN RBC/hpf (ref 0–5)

## 2015-12-08 LAB — CBG MONITORING, ED: Glucose-Capillary: 179 mg/dL — ABNORMAL HIGH (ref 65–99)

## 2015-12-08 MED ORDER — METFORMIN HCL 500 MG PO TABS: 500.0000 mg | ORAL_TABLET | Freq: Two times a day (BID) | ORAL | Status: AC

## 2015-12-08 NOTE — Discharge Instructions (Signed)
Please read and follow all provided instructions.  Your diagnoses today include:  1. Bilateral lower extremity edema   2. Proteinuria   3. Transaminitis   4. Elevated blood pressure     Tests performed today include:  Liver tests - slightly high ALT and AST  Hepatitis panel - pending  Urine test - shows protein in urine  Vital signs. See below for your results today.   Medications prescribed:   Metformin - medication for high blood sugar  Take any prescribed medications only as directed.  Home care instructions:  Follow any educational materials contained in this packet.  Avoid Tylenol until your liver function tests are rechecked.   Follow-up instructions: Please follow-up with your primary care provider in the next 7 days for further evaluation of your symptoms.   Return instructions:   Please return to the Emergency Department if you experience worsening symptoms.   Please return if you have any other emergent concerns.  Additional Information:  Your vital signs today were: BP 152/84 mmHg   Pulse 95   Temp(Src) 98.5 F (36.9 C) (Oral)   Resp 18   Ht 5' 11.75" (1.822 m)   Wt 125.646 kg   BMI 37.85 kg/m2   SpO2 96% If your blood pressure (BP) was elevated above 135/85 this visit, please have this repeated by your doctor within one month. -------------- [employer] The United Ways 211 is a great source of information about community services available.  Access by dialing 2-1-1 from anywhere in [place], or by website -  PooledIncome.pl.   Other Local Resources (Updated 07/2015)  Financial Assistance   Services    Phone Number and Address  [facility]  Low-cost medical care - 1st and 3rd Saturday of every month  Must not qualify for public or private insurance and must have limited income [phone] [address] [place], [place]    [place] [employer] of Social Services  Child  care  Emergency assistance for housing and [employer]  Medicaid [phone] [address] [place], [place] [address]   [facility]  Low-cost medical care for children, communicable diseases, sexually-transmitted diseases, immunizations, maternity care, womens health and family planning [phone] [address] [place], [place] [address]  [facility]   Medication assistance for [facility] residents  Must meet income requirements [phone] [address] [place], [place].    [facility] Social Services  Child care  Emergency assistance for housing and [employer]  Medicaid [phone] [address] [place], [place] [address]  [facility]   Low-cost medical care,   Monday through Friday, 9 am to 6 pm.   Accepts Medicare/Medicaid, and self-pay [phone] [address] [place], [place] [address]  [facility]  Low-cost medical care - Monday through Friday, 8:30 am - 5:30 pm  Accepts Medicaid and self-pay [phone] [address] [place], [place] [address]   [facility]  Primary medical care, including for those with sickle cell disease  Accepts Medicare, Medicaid, insurance and self-pay [phone] [address] [place], [place]  [facility]   Primary medical care  Accepts Medicare, [place], insurance and self-pay [phone] [address] [place], [place] [address]   [facility] Department of Social Services  Child care  Emergency assistance for housing and [employer]  Medicaid [id number] [address] [place], [place] [address]  [facility] of Health and [employer]Human Services  Child care  Emergency assistance for housing and Kimberly-Clarkutilities  Food stamps  Medicaid  [phone] [address] [place], [place] [address]   [facility]  Medication assistance for [facility] residents with no insurance only  Must have a primary care doctor [phone] [address] [place], [place]  [facility]   Primary medical care  [place] Medicare, [place], insurance  [phone] [address] [place], [place]  [facility]   Medication assistance [phone]  [facility]   Primary medical care  Accepts Medicare, [place], insurance and self-pay [phone] [address] [place], [place] [address]  [facility]   Primary medical care  Accepts Medicare, [place], insurance and self-pay [phone] [address] [place], [place] [address]  [facility]  For [place] residents between the ages of 5418 and 6964 who do not have any form of health insurance, Medicare, [place]icaid, or [place] benefits.  Services are provided free of charge to uninsured patients who fall within federal poverty guidelines.    Hours: Tuesdays and Thursdays, 4:15 - 8 pm [phone] [address], [address] [place], [place] [address]  [facility]     Primary medical care  Dental care  Nutritional counseling  Pharmacy  Accepts Medicaid, Medicare, most insurance.  Fees are adjusted based on ability to pay.   [phone] [facility] [address] [place], [place]  [phone] Phineas Realharles Drew [facility] [address] [place], [place]  [phone] [facility] [place], [place]  [phone] [facility], [address] [place], [place]  [phone] [facility] [address] [place], [place]  [facility]  Womens health and family planning [phone] [place], [place]  [facility] Department  of Social Services  Child care  Emergency assistance for housing and Kimberly-Clarkutilities  Food stamps  Medicaid [phone] [address], [place], [place] [address]   [facility]    Ages 4518 and older  Hours: Mondays and Thursdays, 7:00 am - 9:00 am Patients are seen on a first come, first served basis. [phone], [phone]. [phone] [place], [place]  [facility] Social Services  Child care  Emergency assistance for housing and Kimberly-Clarkutilities  Food stamps  Medicaid [phone] [address] [place], [place] [address]  [facility]  Medication assistance  Rental assistance  Food pantry  Medication assistance  Housing assistance  Emergency food distribution  Utility assistance [phone] [address] [place], [place]  [phone]  [address] [place], [place] [address] Hours: Tuesdays and Thursdays from 9am - 12 noon by appointment only  [phone] [address] [place], [place] [address]  [facility] - Lanae Boastlara F. Gunn   Accepts private insurance, [place]are, and [place].  Payment is based on a sliding scale for those without insurance.  Hours: Mondays, Tuesdays and Thursdays, 8:30 am - 5:30 pm.   [phone] [address] [place], [place]  [facility] - Family [facility] at [facility]    Accepts private insurance, [place], and [place]icaid.  Payment is based on a sliding scale for those without insurance. [phone] [address] [place], [place]  [facility] - Pediatrics at E. Scientist, research (physical sciences)[place]  Accepts private insurance, Harrah[employer] EntertainmentMedicare, and [place]icaid.  Payment is based on a sliding scale for those without insurance [phone] [address], [employer], [place]  [facility] - Pediatrics at [employer], [place], and [place].  Payment is based on a sliding scale for those without insurance.  [phone] [address] [place], [place]  [facility] - Pediatrics at [facility], [place], and [place].  Payment is based on a sliding scale for those without insurance. [phone], [phone] [place], [place].    [facility]  Maternity care.  Accepts Medicaid and self-pay. [phone] [address] [place], [place]

## 2015-12-08 NOTE — ED Provider Notes (Signed)
CSN: [id number]     Arrival date & time 12/08/15  1124 History   First MD Initiated Contact with Patient 12/08/15 1152     Chief Complaint  Patient presents with  . Foot Swelling  . Hyperglycemia     (Consider location/radiation/quality/duration/timing/severity/associated sxs/prior Treatment) HPI Comments: Patient with history of diabetes presents with complaint of bilateral lower leg swelling for the past 2 weeks. Patient has noted swelling up to his mid calves. Blood sugars has been running a little high recently in the 200s. Patient denies history of liver, kidney, or heart failure issues. He denies blood pressure medications including calcium channel blockers. He states that he does not stand on his feet for long periods of time. Swelling is not much improved when he wakes up in the morning. He denies chest pain or shortness of breath. No periorbital swelling. No fevers, nausea, vomiting, or diarrhea. Patient states that he does not currently have a primary care physician, but on July 1 will have insurance and plans on getting one. No redness or tenderness associated with the swelling. No history of blood clots. Onset of symptoms acute. Course is constant. Nothing makes symptoms better or worse.  Patient is a 35 y.o. male presenting with hyperglycemia. The history is provided by the patient.  Hyperglycemia Associated symptoms: no abdominal pain, no chest pain, no dysuria, no fever, no increased thirst, no nausea, no shortness of breath and no vomiting     Past Medical History  Diagnosis Date  . Diabetes mellitus    Past Surgical History  Procedure Laterality Date  . Eye surgery     History reviewed. No pertinent family history. Social History  Substance Use Topics  . Smoking status: Never Smoker   . Smokeless tobacco: None  . Alcohol Use: No    Review of Systems  Constitutional: Negative for fever.  HENT: Negative for rhinorrhea and sore throat.   Eyes: Negative for redness.   Respiratory: Negative for cough and shortness of breath.   Cardiovascular: Positive for leg swelling. Negative for chest pain.  Gastrointestinal: Negative for nausea, vomiting, abdominal pain and diarrhea.  Endocrine: Negative for polydipsia.  Genitourinary: Negative for dysuria.  Musculoskeletal: Negative for myalgias.  Skin: Negative for rash.  Neurological: Negative for headaches.   Allergies  Review of patient's allergies indicates no known allergies.  Home Medications   Prior to Admission medications   Medication Sig Start Date End Date Taking? Authorizing Provider  insulin aspart protamine-insulin aspart (NOVOLOG 70/30) (70-30) 100 UNIT/ML injection Inject into the skin See admin instructions. Given according to sliding scale    Historical Provider, MD  insulin glargine (LANTUS) 100 UNIT/ML injection Inject 45 Units into the skin every 12 (twelve) hours.    Historical Provider, MD  insulin glargine (LANTUS) 100 UNIT/ML injection Inject 45 Units into the skin at bedtime. 12/13/11 12/12/12  Forbes Cellar, MD  metFORMIN (GLUCOPHAGE) 500 MG tablet Take 500 mg by mouth 2 (two) times daily with a meal.    Historical Provider, MD  metFORMIN (GLUCOPHAGE) 500 MG tablet Take 1 tablet (500 mg total) by mouth 2 (two) times daily with a meal. 12/13/11 12/12/12  Forbes Cellar, MD   BP 150/84 mmHg  Pulse 98  Temp(Src) 98.4 F (36.9 C) (Oral)  Resp 18  Ht 5' 11.75" (1.822 m)  Wt 125.646 kg  BMI 37.85 kg/m2  SpO2 96%    Physical Exam  Constitutional: He appears well-developed and well-nourished.  HENT:  Head: Normocephalic and atraumatic.  Eyes:  Conjunctivae are normal. Right eye exhibits no discharge. Left eye exhibits no discharge.  Neck: Normal range of motion. Neck supple.  Cardiovascular: Normal rate, regular rhythm and normal heart sounds.   No murmur heard. Pulmonary/Chest: Effort normal and breath sounds normal. No respiratory distress. He has no wheezes. He has no rales.   Abdominal: Soft. There is no tenderness.  Musculoskeletal: He exhibits edema and tenderness.  Patient with symmetric 2+ lower extremity pitting edema bilaterally to proximal calves. No erythema or tenderness.  Neurological: He is alert.  Skin: Skin is warm and dry.  Psychiatric: He has a normal mood and affect.  Nursing note and vitals reviewed.   ED Course  Procedures (including critical care time) Labs Review Labs Reviewed  CBC WITH DIFFERENTIAL/PLATELET - Abnormal; Notable for the following:    Hemoglobin 12.2 (*)    HCT 37.3 (*)    All other components within normal limits  COMPREHENSIVE METABOLIC PANEL - Abnormal; Notable for the following:    Glucose, Bld 129 (*)    Creatinine, Ser 1.25 (*)    Total Protein 6.0 (*)    AST 296 (*)    ALT 100 (*)    All other components within normal limits  URINALYSIS, ROUTINE W REFLEX MICROSCOPIC (NOT [facility]) - Abnormal; Notable for the following:    Color, Urine AMBER (*)    Glucose, UA >1000 (*)    Hgb urine dipstick LARGE (*)    Protein, ur 30 (*)    All other components within normal limits  URINE MICROSCOPIC-ADD ON - Abnormal; Notable for the following:    Squamous Epithelial / LPF 0-5 (*)    Bacteria, UA RARE (*)    All other components within normal limits  CBG MONITORING, ED - Abnormal; Notable for the following:    Glucose-Capillary 179 (*)    All other components within normal limits  HEPATITIS PANEL, ACUTE    12:08 PM Patient seen and examined. Work-up initiated.   Vital signs reviewed and are as follows: BP 150/84 mmHg  Pulse 98  Temp(Src) 98.4 F (36.9 C) (Oral)  Resp 18  Ht 5' 11.75" (1.822 m)  Wt 125.646 kg  BMI 37.85 kg/m2  SpO2 96%  Patient informed of lab results. We discussed mildly elevated liver enzymes. Patient denies any alcohol or heavy Tylenol use. We discussed slight amount of protein in urine. Discussed slightly low protein level and blood. We discussed that there is no acute indication for  admission, however these will need to be closely monitored and rechecked by primary care physician. Discussed avoiding Tylenol use as well as high salt in diet. Discussed use of elevation and compression stockings. Strongly encouraged patient follow-up with PCP.  I printed out out referrals, refill of metformin, and ordered hepatitis panel. However, I was informed by nursing staff that patient left emergency department prior to receiving this information or having blood drawn for hepatitis panel.  MDM   Final diagnoses:  Bilateral lower extremity edema  Proteinuria  Transaminitis  Elevated blood pressure   Patient with bilateral lower extremity edema. Patient has slight abnormalities including mild transaminitis, proteinuria which will need to be followed as an outpatient. Tx and recommendations as above.   No dangerous or life-threatening conditions suspected or identified by history, physical exam, and by work-up. No indications for hospitalization identified.     Renne CriglerJoshua Larin Weissberg, PA-C 12/08/15 1508  Linwood DibblesJon Knapp, MD 12/10/15 [phone]

## 2015-12-08 NOTE — ED Notes (Signed)
Patient c/o hyperglycemia and bilateral foot swelling x 2 weeks. Patient states he ate approx 30 minutes prior to coming to the ED and his CBG-220 at that time.

## 2015-12-08 NOTE — ED Notes (Signed)
Pt left before drawing labs

## 2015-12-08 NOTE — ED Notes (Signed)
Pt left before d/c papers received also hepatic panel was not drawn before he left
